# Patient Record
Sex: Male | Born: 1955 | Race: White | Hispanic: No | Marital: Single | Smoking: Former smoker
Health system: Southern US, Community
[De-identification: ages and names within clinical notes are randomized; demographics above are authoritative.]

---

## 2003-10-30 ENCOUNTER — Emergency Department (HOSPITAL_COMMUNITY): Admission: RE | Admit: 2003-10-30 | Discharge: 2003-10-30 | Payer: Self-pay | Admitting: Emergency Medicine

## 2018-09-24 ENCOUNTER — Other Ambulatory Visit (HOSPITAL_COMMUNITY): Payer: Self-pay | Admitting: Family Medicine

## 2018-09-24 DIAGNOSIS — N62 Hypertrophy of breast: Secondary | ICD-10-CM

## 2018-09-25 ENCOUNTER — Ambulatory Visit (HOSPITAL_COMMUNITY): Payer: Self-pay

## 2018-10-01 ENCOUNTER — Ambulatory Visit (HOSPITAL_COMMUNITY): Payer: No Typology Code available for payment source

## 2018-10-01 ENCOUNTER — Other Ambulatory Visit: Payer: Self-pay

## 2018-10-01 ENCOUNTER — Ambulatory Visit (HOSPITAL_COMMUNITY)
Admission: RE | Admit: 2018-10-01 | Discharge: 2018-10-01 | Disposition: A | Discharge status: Home / Self Care | Payer: No Typology Code available for payment source | Source: Ambulatory Visit | Attending: Family Medicine | Admitting: Family Medicine

## 2018-10-01 DIAGNOSIS — N62 Hypertrophy of breast: Secondary | ICD-10-CM | POA: Insufficient documentation

## 2018-10-11 ENCOUNTER — Other Ambulatory Visit: Payer: Self-pay | Admitting: Family Medicine

## 2018-10-11 ENCOUNTER — Other Ambulatory Visit (HOSPITAL_COMMUNITY): Payer: Self-pay | Admitting: Family Medicine

## 2018-10-11 DIAGNOSIS — M25511 Pain in right shoulder: Secondary | ICD-10-CM

## 2018-10-17 ENCOUNTER — Ambulatory Visit (HOSPITAL_COMMUNITY): Payer: Non-veteran care

## 2018-11-01 ENCOUNTER — Other Ambulatory Visit: Payer: Self-pay

## 2018-11-01 ENCOUNTER — Ambulatory Visit (HOSPITAL_COMMUNITY)
Admission: RE | Admit: 2018-11-01 | Discharge: 2018-11-01 | Disposition: A | Discharge status: Home / Self Care | Payer: No Typology Code available for payment source | Source: Ambulatory Visit | Attending: Family Medicine | Admitting: Family Medicine

## 2018-11-01 DIAGNOSIS — M25511 Pain in right shoulder: Secondary | ICD-10-CM | POA: Diagnosis not present

## 2019-10-22 IMAGING — MR MRI OF THE RIGHT SHOULDER WITHOUT CONTRAST
4 of 5 series · 19 of 40 positions shown · non-contrast
Comparison: None.

CLINICAL DATA: Right shoulder pain.

EXAM:
MRI OF THE RIGHT SHOULDER WITHOUT CONTRAST
TECHNIQUE: Multiplanar, multisequence MR imaging of the shoulder was performed.
No intravenous contrast was administered.

[Series 5: PD fat-sat · axial · 4.0mm · 0.26mm/px · z∈[-62,+42]mm · 8 of 24 slices shown (1 of 2)]
[im 1/24]
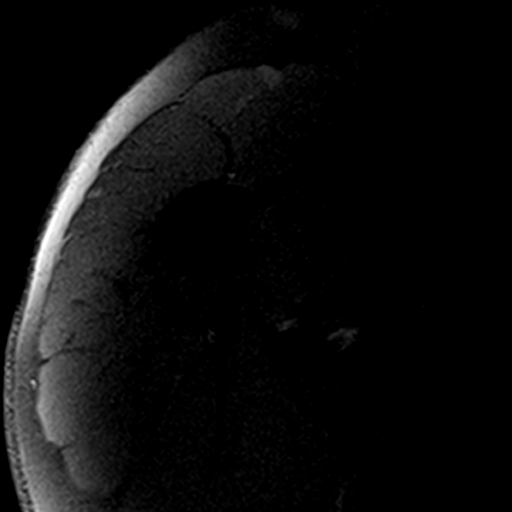
[im 4/24]
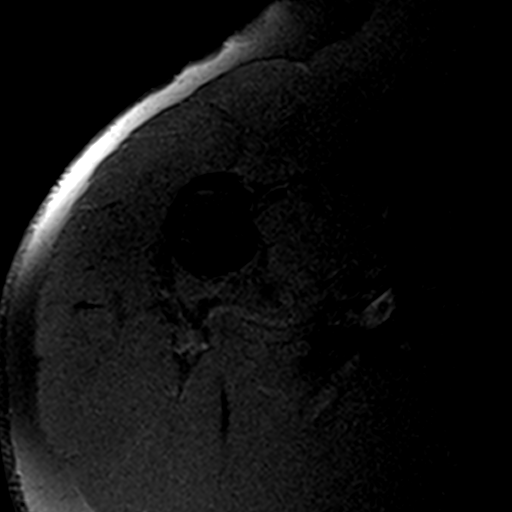
[im 7/24]
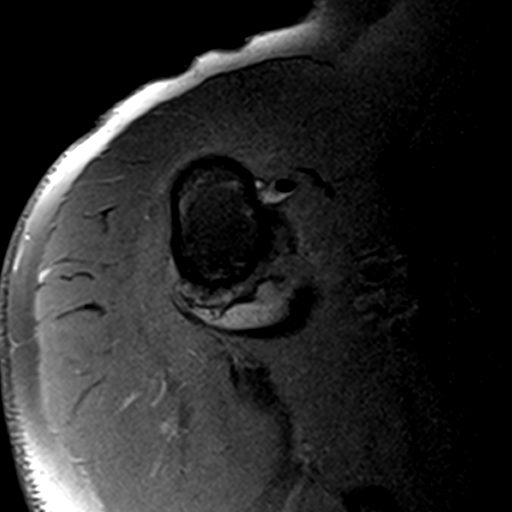
[im 10/24]
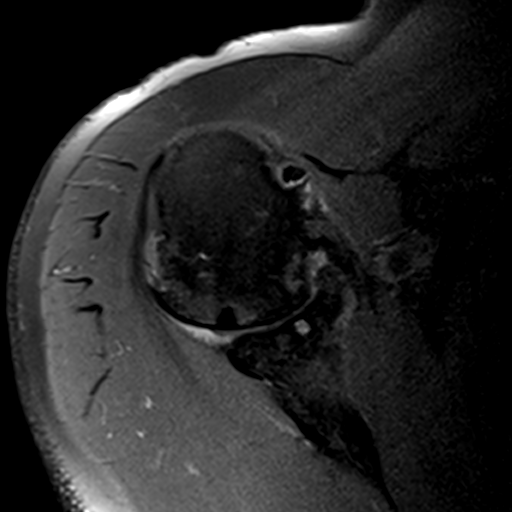
[im 14/24]
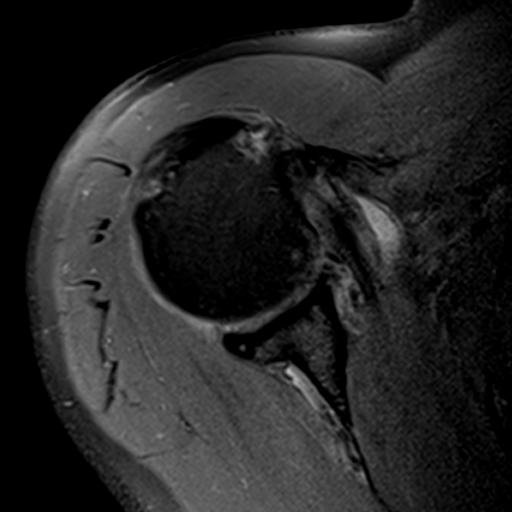
[im 17/24]
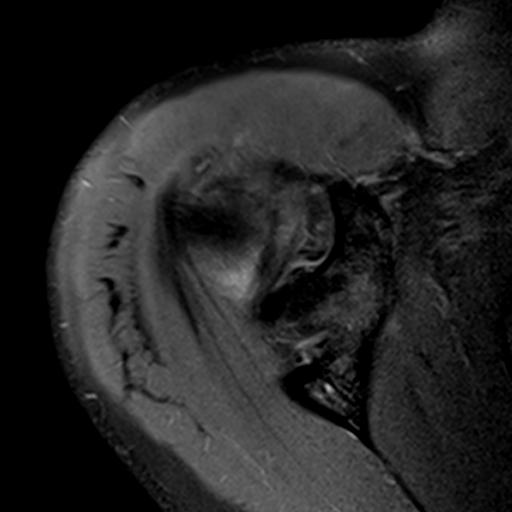
[im 20/24]
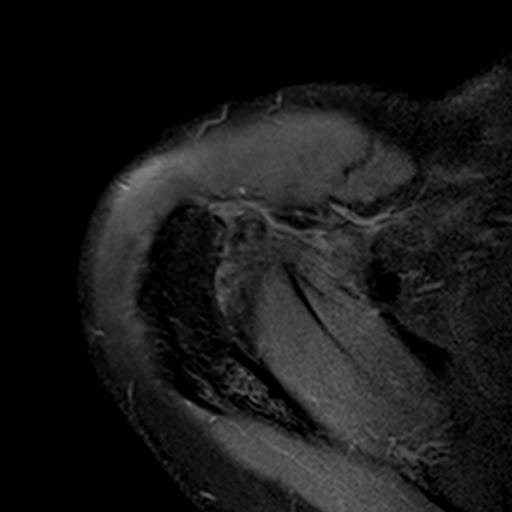
[im 24/24]
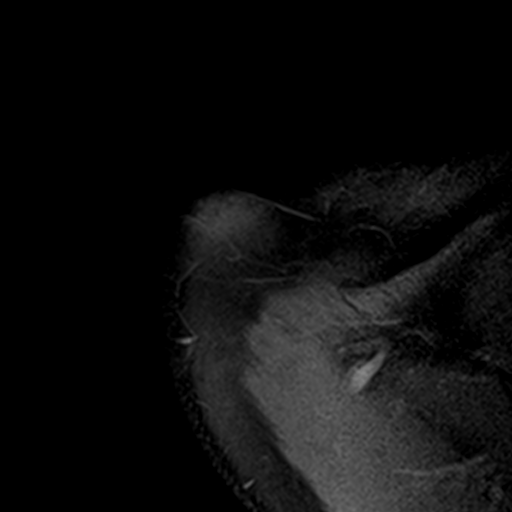

[Series 6: T2 fat-sat · oblique · 4.0mm · 0.25mm/px · 3 of 22 slices shown (1 of 2)]
[im 4/22]
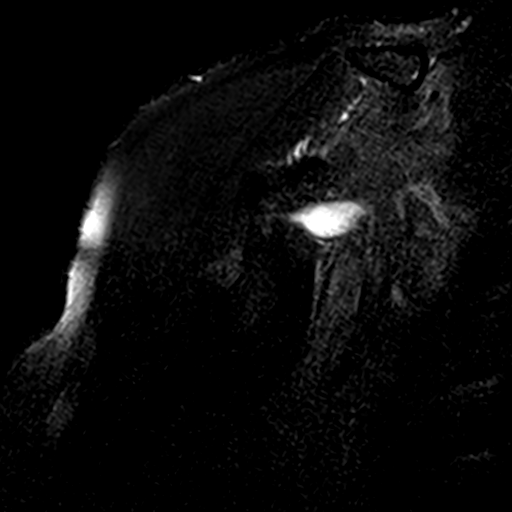
[im 13/22]
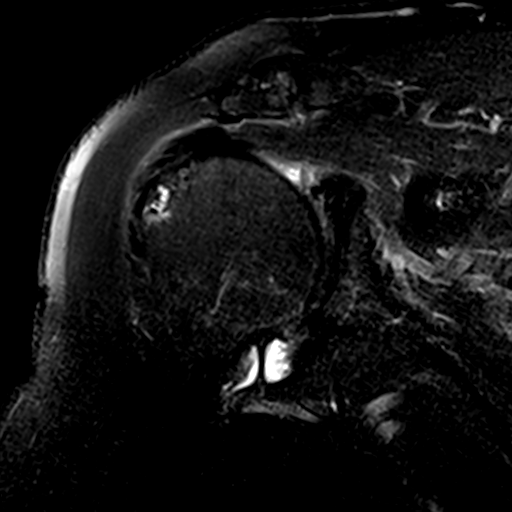
[im 19/22]
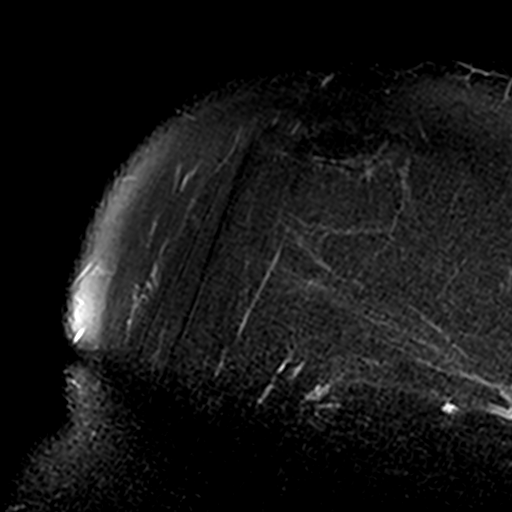

[Series 7: PD fat-sat · oblique · 4.0mm · 0.25mm/px · 5 of 22 slices shown (2 of 2)]
[im 1/22]
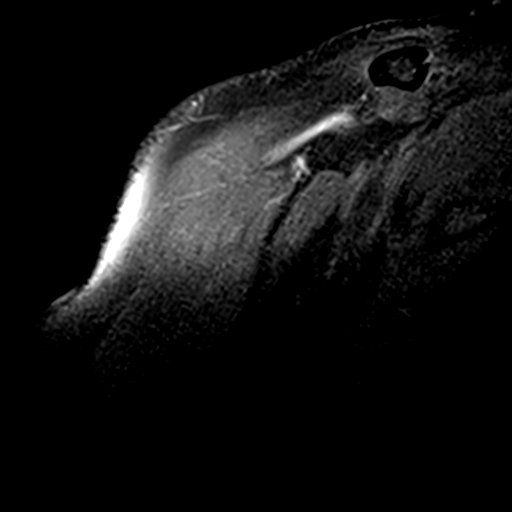
[im 4/22]
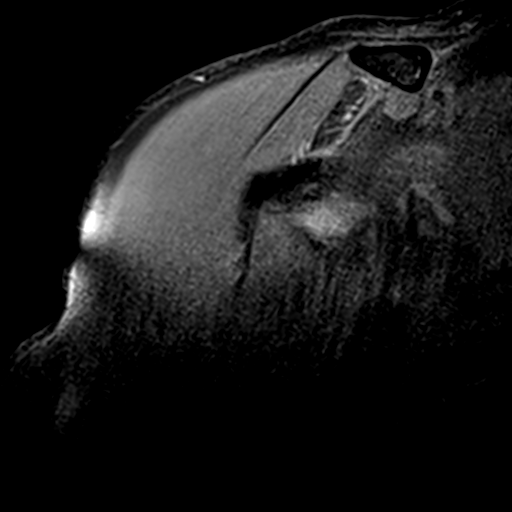
[im 7/22]
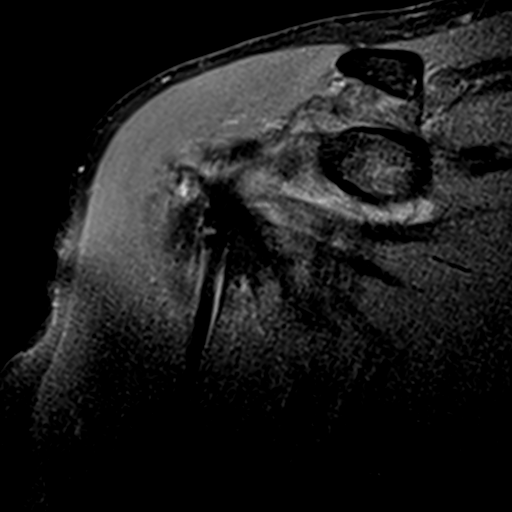
[im 13/22]
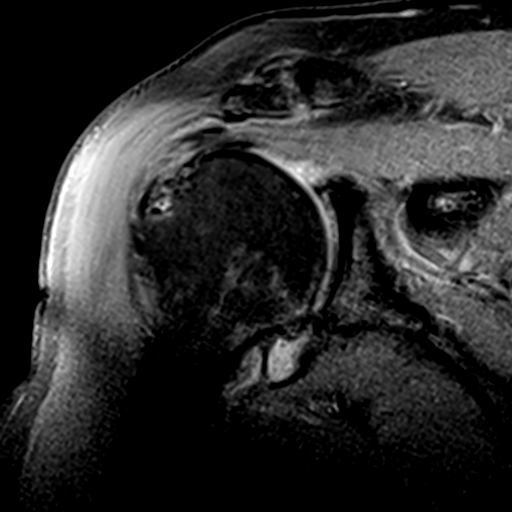
[im 19/22]
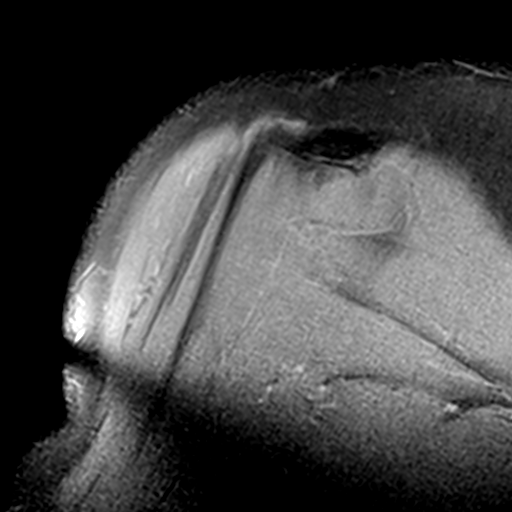

[Series 8: T2 fat-sat · coronal · 4.0mm · 0.28mm/px · 3 of 24 slices shown (2 of 2)]
[im 4/24]
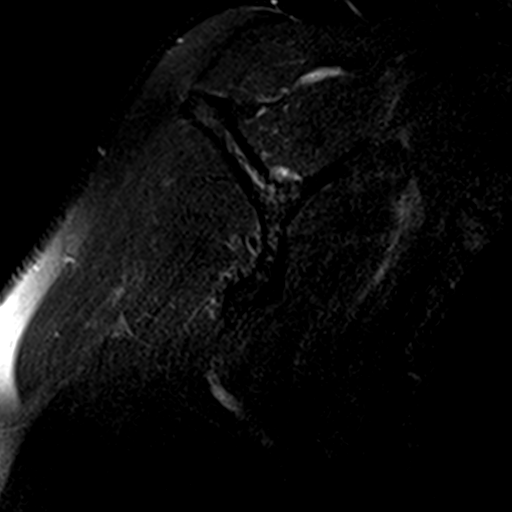
[im 14/24]
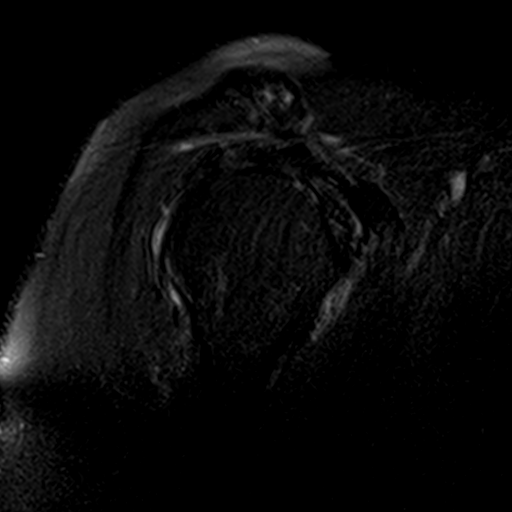
[im 20/24]
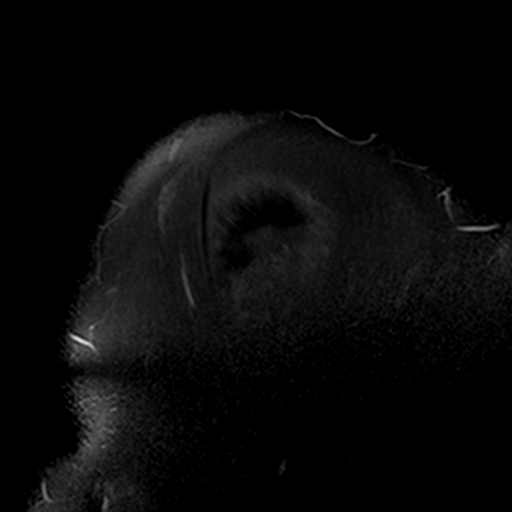

[19 of 40 positions shown; findings below may reference images not displayed]

FINDINGS: Rotator cuff: There are focal slight degenerative changes of the
anterior aspect of the distal supraspinatus tendon without a tear.
The remainder of the rotator cuff is normal.

Muscles: No atrophy or abnormal signal of the muscles of the rotator
cuff.

Biceps long head: Properly located. Focal intrinsic degeneration of
the proximal tendon in the bicipital groove.

Acromioclavicular Joint: Mild AC joint arthropathy. Type 2 acromion.
No bursitis

Glenohumeral Joint: Moderately severe osteoarthritis of the
glenohumeral joint. Marginal osteophyte formation with subcortical
cysts in the humeral head and glenoid. Small joint effusion with
some debris in the joint.

Labrum: No discrete tear. Degenerative changes and distortion of the
contour of the inferior labrum.

Bones: Osteoarthritis of the glenohumeral joint as described above.
Slight degenerative changes of the greater tuberosity.

Other: None
IMPRESSION: 1. Moderately severe glenohumeral joint arthritis.
2. Degenerative changes of the glenoid.
3. Minimal degenerative changes of the anterior aspect of the distal
supraspinatus tendon without a tear.

## 2020-09-16 ENCOUNTER — Other Ambulatory Visit: Payer: Self-pay

## 2020-09-16 ENCOUNTER — Encounter: Payer: Self-pay | Admitting: General Surgery

## 2020-09-16 ENCOUNTER — Ambulatory Visit (INDEPENDENT_AMBULATORY_CARE_PROVIDER_SITE_OTHER): Payer: Medicare Other | Admitting: General Surgery

## 2020-09-16 VITALS — BP 139/79 | HR 88 | Temp 98.1°F | Resp 14 | Ht 67.0 in | Wt 227.0 lb

## 2020-09-16 DIAGNOSIS — K429 Umbilical hernia without obstruction or gangrene: Secondary | ICD-10-CM

## 2020-09-16 NOTE — Patient Instructions (Signed)

## 2020-09-18 NOTE — Progress Notes (Signed)
Dustin Pena; [id number]; 09-06-1955   HPI Patient is a 65 year old white male who was referred to my care by Dr. Olena Leatherwood for evaluation and treatment of an umbilical hernia.  Patient has had an umbilical hernia for many years.  He did notice some increase in size recently.  It is made worse with straining.  He has never had surgery in that area.  He denies any nausea or vomiting. History reviewed. No pertinent past medical history.  History reviewed. No pertinent surgical history.  History reviewed. No pertinent family history.  Current Outpatient Medications on File Prior to Visit  Medication Sig Dispense Refill  . alfuzosin (UROXATRAL) 10 MG 24 hr tablet Take 20 mg by mouth daily.    Marland Kitchen aspirin EC 81 MG tablet Take 81 mg by mouth daily. Swallow whole.    . empagliflozin (JARDIANCE) 25 MG TABS tablet Take 25 mg by mouth daily.    Marland Kitchen gabapentin (NEURONTIN) 300 MG capsule Take 300 mg by mouth 2 (two) times daily.    Marland Kitchen lisinopril (ZESTRIL) 2.5 MG tablet Take 2.5 mg by mouth daily.    Marland Kitchen loratadine (CLARITIN) 10 MG tablet Take 10 mg by mouth daily.    . metFORMIN (GLUCOPHAGE) 1000 MG tablet Take 1,000 mg by mouth 2 (two) times daily with a meal.    . omeprazole (PRILOSEC) 20 MG capsule Take 20 mg by mouth daily.    . simvastatin (ZOCOR) 40 MG tablet Take 40 mg by mouth daily.    . traMADol (ULTRAM) 50 MG tablet Take 100 mg by mouth in the morning, at noon, and at bedtime.     No current facility-administered medications on file prior to visit.    No Known Allergies  Social History   Substance and Sexual Activity  Alcohol Use Not Currently    Social History   Tobacco Use  Smoking Status Former Smoker  . Quit date: 04/28/2020  . Years since quitting: 0.3  Smokeless Tobacco Never Used    Review of Systems  Constitutional: Positive for malaise/fatigue.  HENT: Negative.   Eyes: Negative.   Respiratory: Negative.   Cardiovascular: Negative.   Gastrointestinal: Negative.    Genitourinary: Positive for frequency.  Musculoskeletal: Positive for back pain and joint pain.  Skin: Negative.   Neurological: Negative.   Endo/Heme/Allergies: Negative.   Psychiatric/Behavioral: Negative.     Objective   Vitals:   09/16/20 1042  BP: 139/79  Pulse: 88  Resp: 14  Temp: 98.1 F (36.7 C)  SpO2: 96%    Physical Exam Vitals reviewed.  Constitutional:      Appearance: Normal appearance. He is not ill-appearing.  HENT:     Head: Normocephalic and atraumatic.  Cardiovascular:     Rate and Rhythm: Normal rate and regular rhythm.     Heart sounds: Normal heart sounds. No murmur heard. No friction rub. No gallop.   Pulmonary:     Effort: Pulmonary effort is normal. No respiratory distress.     Breath sounds: Normal breath sounds. No stridor. No wheezing, rhonchi or rales.  Abdominal:     General: Bowel sounds are normal. There is no distension.     Palpations: Abdomen is soft. There is no mass.     Tenderness: There is no abdominal tenderness. There is no guarding or rebound.     Hernia: A hernia is present.     Comments: Moderate sized reducible umbilical hernia.  Skin:    General: Skin is warm and dry.  Neurological:  Mental Status: He is alert and oriented to person, place, and time.   Primary care notes reviewed  Assessment  Umbilical hernia, currently asymptomatic Plan   I told the patient that he did not have to have his hernia repaired unless he became more symptomatic.  He understands and agrees.  Literature was given.  He will follow-up with me should he become more symptomatic and would like to proceed with umbilical herniorrhaphy with mesh.

## 2022-11-17 ENCOUNTER — Other Ambulatory Visit (HOSPITAL_COMMUNITY): Payer: Self-pay | Admitting: Internal Medicine

## 2022-11-17 DIAGNOSIS — Z87891 Personal history of nicotine dependence: Secondary | ICD-10-CM

## 2023-01-04 ENCOUNTER — Ambulatory Visit (HOSPITAL_COMMUNITY): Payer: Medicare Other

## 2023-01-11 ENCOUNTER — Ambulatory Visit (HOSPITAL_COMMUNITY)
Admission: RE | Admit: 2023-01-11 | Discharge: 2023-01-11 | Disposition: A | Discharge status: Home / Self Care | Payer: Medicare Other | Source: Ambulatory Visit | Attending: Internal Medicine | Admitting: Internal Medicine

## 2023-01-11 DIAGNOSIS — Z87891 Personal history of nicotine dependence: Secondary | ICD-10-CM | POA: Diagnosis present

## 2024-03-13 ENCOUNTER — Encounter (INDEPENDENT_AMBULATORY_CARE_PROVIDER_SITE_OTHER): Payer: Self-pay | Admitting: *Deleted
